# Patient Record
Sex: Female | Born: 1980 | Race: Black or African American | Hispanic: No | Marital: Single | Smoking: Current every day smoker
Health system: Southern US, Community
[De-identification: ages and names within clinical notes are randomized; demographics above are authoritative.]

## PROBLEM LIST (undated history)

## (undated) DIAGNOSIS — J302 Other seasonal allergic rhinitis: Secondary | ICD-10-CM

## (undated) DIAGNOSIS — Z789 Other specified health status: Secondary | ICD-10-CM

## (undated) HISTORY — PX: NO PAST SURGERIES: SHX2092

## (undated) HISTORY — PX: KNEE SURGERY: SHX244

---
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
## 2000-02-10 ENCOUNTER — Ambulatory Visit (HOSPITAL_COMMUNITY): Admission: RE | Admit: 2000-02-10 | Discharge: 2000-02-10 | Payer: Self-pay | Admitting: *Deleted

## 2000-02-10 ENCOUNTER — Encounter: Payer: Self-pay | Admitting: *Deleted

## 2000-04-24 ENCOUNTER — Inpatient Hospital Stay (HOSPITAL_COMMUNITY): Admission: AD | Admit: 2000-04-24 | Discharge: 2000-04-27 | Payer: Self-pay | Admitting: *Deleted

## 2000-07-06 ENCOUNTER — Emergency Department (HOSPITAL_COMMUNITY): Admission: EM | Admit: 2000-07-06 | Discharge: 2000-07-06 | Payer: Self-pay | Admitting: Emergency Medicine

## 2001-01-23 ENCOUNTER — Emergency Department (HOSPITAL_COMMUNITY): Admission: EM | Admit: 2001-01-23 | Discharge: 2001-01-23 | Payer: Self-pay | Admitting: Emergency Medicine

## 2001-03-23 ENCOUNTER — Ambulatory Visit (HOSPITAL_COMMUNITY): Admission: RE | Admit: 2001-03-23 | Discharge: 2001-03-24 | Payer: Self-pay | Admitting: Orthopedic Surgery

## 2001-06-10 ENCOUNTER — Encounter: Admission: RE | Admit: 2001-06-10 | Discharge: 2001-08-01 | Payer: Self-pay | Admitting: Orthopedic Surgery

## 2006-03-25 ENCOUNTER — Inpatient Hospital Stay (HOSPITAL_COMMUNITY): Admission: AD | Admit: 2006-03-25 | Discharge: 2006-03-27 | Payer: Self-pay | Admitting: Obstetrics

## 2007-07-21 ENCOUNTER — Inpatient Hospital Stay (HOSPITAL_COMMUNITY): Admission: AD | Admit: 2007-07-21 | Discharge: 2007-07-21 | Payer: Self-pay | Admitting: Obstetrics and Gynecology

## 2008-01-06 ENCOUNTER — Inpatient Hospital Stay (HOSPITAL_COMMUNITY): Admission: AD | Admit: 2008-01-06 | Discharge: 2008-01-08 | Payer: Self-pay | Admitting: Obstetrics and Gynecology

## 2010-08-26 NOTE — H&P (Signed)
NAMESHIKARA, MCAULIFFE              ACCOUNT NO.:  [id number]   MEDICAL RECORD NO.:  [id number]          PATIENT TYPE:  INP   LOCATION:  9113                          FACILITY:  [facility]   PHYSICIAN:  Osborn Coho, M.D.   DATE OF BIRTH:  1980-06-23   DATE OF ADMISSION:  01/06/2008  DATE OF DISCHARGE:                              HISTORY & PHYSICAL   Taylor York is a 30 year old single black female gravida 3, para 2-0-0-  2 at 40-4/7 weeks per an [facility] of January 02, 2008, who presents in  active labor unannounced.  Reports positive fetal movement.  She reports  some irregular contractions yesterday, but became regular about 3 a.m.  Denied any leakage of fluid, some bloody show, without other complaints.  Was desiring epidural on arrival.  She arrived at the hospital at  approximately [phone].  She has been followed by CNM service at [facility].  History remarkable for:   1. Macrosomia with previous pregnancy.  2. History of postpartum depression.  3. History of STDs.  4. First trimester chlamydia.  5. Patient with a history of polydactyly.   OBSTETRICAL HISTORY:  Gravida 1 spontaneous vaginal delivery January of  2002, female infant, Shela Commons, weighing 7 pounds 8 ounces at 39-5/7 weeks  after 24 hours of labor.  She did have an epidural.  No complications.  Gravida 2 spontaneous vaginal delivery December of 2007 of female,  Payton Doughty, weighing 9 pounds 7 ounces at 39 weeks, 12 hours of labor,  epidural, no complications by Dr. Gaynell Face.  Gravida 3 is current  pregnancy.   PRENATAL LABORATORIES:  Blood type is A+, Rh antibody screen negative,  RPR nonreactive, rubella titer immune.  Hepatitis surface antigen  negative, HIV nonreactive.  Cystic fibrosis screen was negative.  Pap  within normal limits March 5th.  Gonorrhea and chlamydia cultures.  Gonorrhea was negative at that time; however, chlamydia was positive.  Test of cure gonorrhea and chlamydia cultures were negative March 31.  Hemoglobin on  March the 5th 11.9, platelets were 191.  Group beta strep  negative in her 3rd trimester as well as gonorrhea and chlamydia  cultures also negative.   She reports SHRIMP causes swelling, hives, and itching.  She denies  medication or latex allergies.   Reports menarche at age 11 and cycles monthly, no abnormalities.  LMP  March 26, 2007 giving her an [facility] of January 02, 2008.  Had some  postpartum depression after her 2nd delivery on no medications as well  as after her 1st delivery for 6-7 months.  She has used oral  contraceptive pills in the past as well as condoms and Ortho Evra patch.  Treated in 2007 for Trichomonas, infrequent yeast infections, unsure  regarding varicella.  Patient with varicosities.  She had a history of  torn ACL 2003.  Was playing basketball which she had repaired.  She has  also had wisdom teeth extraction x3 in March of 2008.   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   FAMILY HISTORY:  Maternal grandmother chronic hypertension.  Mother with  varicosities.  Mother with seizure disorder when she was younger or  history of seizures.   SOCIAL HISTORY:  She is a single black female.  Father of the baby's  name is Gloris Ham.  He was not in labor and delivery today, unsure  if they are in a relationship still.  He has not been present at any of  her appointments I am aware of.  She did come in and was upset regarding  relationship matters around 25 weeks and was tearful and talked to Darnelle Maffucci, our  maternity care coordinator, about situation.  The patient  has a high school education, is unemployed.  Father of the baby has a  high school education, is a Electrical engineer.  Her sister and mother were  present.  Brought her into the hospital at time of admission tonight.  She denied alcohol, tobacco or illicit drug use.   HISTORY OF PRESENT  PREGNANCY:  The patient entered care February 23rd  for a new OB interview.  She was approximately 10-1/7 weeks.  Her  pregravid weight was 194.  Her height is 5 feet 8 inches.  She weighed  202 at her new OB workup on March the 5th at 11-3/7 weeks.  Planned CNM  care.  Planned also 1st trimester screen.  PAP was done at that time,  and had positive chlamydia culture and was treated.  She had a normal  1st trimester screen.  She was seen at 15 weeks.  Had GC and chlamydia  test of cure that were negative.  She complained of some brownish  discharge at that time.  Wet prep was positive for hyphae and was  treated with Diflucan.  She did have an early 1-hour GTT secondary to  history of macrosomia with her 2nd pregnancy which was within normal  limits.  She had a normal anatomy ultrasound at 18-4/7 weeks with normal  growth and development.  She had a wet prep again at 18-4/7 weeks and  was treated with Terazol 7.  As noted earlier, at 25-2/7 weeks she was  tearful in the office regarding father of baby and issues with their  relationship.  Denied any abuse or other domestic issues, reported  safety.  She at 28-2/7 weeks had a repeat 1-hour GTT, and it as well was  within normal limits.  Her hemoglobin at that time was 11.5.  RPR at  that time was nonreactive.  At approximately 30 weeks she had a total 20-  pound weight gain.  The patient is considering Implanon or IUD  postpartum.  She does plan to bottle feed.  The patient's pregnancy  progressed without any other noted complications with exception of some  minor pregnancy discomfort until her admission today.   PHYSICAL EXAMINATION:  VITAL SIGNS ON ADMISSION:  133/84, afebrile,  heart rate was 84.  EFM:  Fetal heart rate was in 120s, moderate  variability, some earlies and variables.  Toco contractions every 2  minutes, moderate to strong on palpation.  GENERAL:  She was in noted distress with her contractions, labored  breathing, but  alert and oriented x3.  HEENT:  Grossly intact within normal limits.  CARDIOVASCULAR:  Regular rate and rhythm without murmur.  LUNGS:  Clear to auscultation bilaterally.  ABDOMEN:  Soft, nontender, and gravid.  CERVICAL:  8 cm, 90%, -1 to 0 with bulging membranes.  EXTREMITIES:  Some mild  generalized lower extremity edema, nonpitting.  DTRs 2+, no clonus.   IMPRESSION:  1. Intrauterine pregnancy at 40-4/7 weeks.  2. Transition with bulging membranes.  3. Group beta strep negative.  4. History of macrosomia.   PLAN:  1. Admit to birthing suites with Dr. Su Hilt as attending physician.  2. Routine L&D orders.  3. Epidural as soon as possible.  4. AROM when comfortable.  5. Consult with MD p.r.n.      Candice Denny Levy, CNM      ______________________________  Osborn Coho, M.D.    CHS/MEDQ  D:  01/06/2008  T:  01/06/2008  Job:  [id number]

## 2010-08-29 NOTE — H&P (Signed)
[place]. [facility]  Patient:    Taylor York, Taylor York Visit Number: [id number] MRN: [id number]          Service Type: DSU Location: PEDS 6153 01 Attending Physician:  Wende Mott Dictated by:   Kennieth Rad, M.D. Admit Date:  03/23/2001                           History and Physical  CHIEF COMPLAINT:  Painful swollen left knee.  HISTORY OF PRESENT ILLNESS:  This is a 30 year old, young, black female who had a fall down some stairs on the day before Thanksgiving and sustained injury to her left knee.  The patient went to the [facility] Emergency Room on Thanksgiving Day the following day and had x-rays done and a knee immobilizer applied.  The patient returned to the office this week for the left knee injury.  The patient complained of pain, persistent swelling, and inability to bend the left knee.  PAST MEDICAL HISTORY:  No previous operations.  No history of high blood pressure or diabetes.  The patient has one child.  ALLERGIES:  None.  MEDICATIONS:  Hydrocodone.  FAMILY HISTORY:  Noncontributory.  REVIEW OF SYSTEMS:  Basically that of the history of present illness. Otherwise the patient has been in good health.  The patient denies being pregnant and has had regular menstrual periods.  Her last period was during the Thanksgiving weekend.  PHYSICAL EXAMINATION:  Temperature 99.2 degrees, pulse 76, respirations 18, blood pressure 125/64.  HEIGHT:  5 feet 8 inches.  WEIGHT:  165 pounds.  HEENT:  Normocephalic.  Eyes:  Conjunctivae and sclerae clear.  NECK:  Supple.  CHEST:  Clear.  CARDIAC:  S1 and S2 regular.  EXTREMITIES:  Left knee with limited range of motion, +2 effusion, and tender anteriorly.  Antalgic gait.  Neurovascular is intact.  X-ray revealed large bone fragment in the joint.  IMPRESSION:  Anterior cruciate ligament tear with avulsion of large bone fragment of the left knee. Dictated by:   Kennieth Rad, M.D. Attending Physician:  Wende Mott DD:  03/23/01 TD:  03/23/01 Job: [id number]

## 2010-08-29 NOTE — Op Note (Signed)
[place]. [facility]  Patient:    Taylor York, Taylor York Visit Number: [id number] MRN: [id number]          Service Type: DSU Location: PEDS 6153 01 Attending Physician:  Wende Mott Dictated by:   Kennieth Rad, M.D. Proc. Date: 03/23/01 Admit Date:  03/23/2001                             Operative Report  PREOPERATIVE DIAGNOSIS:  Anterior cruciate ligament tear and bone fragment of left knee.  POSTOPERATIVE DIAGNOSIS:  Anterior cruciate ligament tear and bone fragment of left knee.  PROCEDURES: 1. Arthroscopy. 2. Arthrotomy and repair of anterior cruciate ligament ligament of left knee    with anterior cruciate ligament screw.  ANESTHESIA:  General.  SURGEON:  Kennieth Rad, M.D.  DESCRIPTION OF PROCEDURE:  The patient was taken to the operating room after being given adequate preoperative medications.  Given general anesthesia and intubated.  The left knee was prepped with DuraPrep and draped in a sterile manner.  A tourniquet was used for hemostasis and Bovie.  A 1/2-inch puncture wound was made along the anterior, medial, and lateral joint lines.  Inflow of water through the medial suprapatellar pouch area.  Inspection of the joint revealed tremendous hematoma, which was evacuated.  Copious irrigation was done.  Inspection of the joint revealed ACL disk rupture with large bone fragment with articular cartilage attached to it.  Menisci intact.  Next, arthrotomy midline anterior incision made over the left knee going through the skin and subcutaneous tissue.  A medial paramedian incision was made into the capsule.  The patellar tendon was reflected laterally.  Identification of the fragment attached to the ACL was made.  The defect in the tibial surface was identified and the fragment was compressed back in its place.  With the ACL attached to it, this was stabilized with the ACL and screw.  A guide wire was placed down through the  fragment followed by punch reamer followed by placement of a 9 x 20 mm absorbable ACL screw.  The fragment was good and stable.  Further copious irrigation was done with antibiotic solution.  Wound closure was then done with 0 Vicryl for the fascia, 2-0 Vicryl for the subcutaneous, and skin staples for the skin.  A compressive dressing was applied.  A knee immobilizer was applied.  The patient tolerated the procedure quite well and went to the recovery room in stable and satisfactory condition. Dictated by:   Kennieth Rad, M.D. Attending Physician:  Wende Mott DD:  03/23/01 TD:  03/23/01 Job: [id number]

## 2011-01-12 LAB — CBC
HCT: 32.6 — ABNORMAL LOW
HCT: 36.9
Hemoglobin: 10.8 — ABNORMAL LOW
Hemoglobin: 12.3
MCHC: 33.1
MCHC: 33.4
MCV: 91.3
MCV: 92.8
Platelets: 154
Platelets: 166
RBC: 3.52 — ABNORMAL LOW
RBC: 4.05
RDW: 13.7
RDW: 14.3
WBC: 11.2 — ABNORMAL HIGH
WBC: 15.6 — ABNORMAL HIGH

## 2011-01-12 LAB — RPR: RPR Ser Ql: NONREACTIVE

## 2011-09-09 ENCOUNTER — Emergency Department (INDEPENDENT_AMBULATORY_CARE_PROVIDER_SITE_OTHER)
Admission: EM | Admit: 2011-09-09 | Discharge: 2011-09-09 | Disposition: A | Discharge status: Home / Self Care | Payer: Medicaid Other | Source: Home / Self Care | Attending: Family Medicine | Admitting: Family Medicine

## 2011-09-09 ENCOUNTER — Encounter (HOSPITAL_COMMUNITY): Payer: Self-pay | Admitting: Emergency Medicine

## 2011-09-09 DIAGNOSIS — Z041 Encounter for examination and observation following transport accident: Secondary | ICD-10-CM

## 2011-09-09 DIAGNOSIS — S0990XA Unspecified injury of head, initial encounter: Secondary | ICD-10-CM

## 2011-09-09 MED ORDER — CYCLOBENZAPRINE HCL 5 MG PO TABS: 5.0000 mg | ORAL_TABLET | Freq: Three times a day (TID) | ORAL | Status: AC | PRN

## 2011-09-09 MED ORDER — IBUPROFEN 800 MG PO TABS: 800.0000 mg | ORAL_TABLET | Freq: Three times a day (TID) | ORAL | Status: AC
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
## 2011-09-11 NOTE — ED Notes (Signed)
Pt. came in for work note. States she has been having to take the muscle relaxer TID and it makes her woozy. Discussed with Dr. Ladon Applebaum and he said she can return tomorrow. Note done as directed and given to pt. Taylor York 09/11/2011

## 2011-10-11 ENCOUNTER — Emergency Department (HOSPITAL_COMMUNITY)
Admission: EM | Admit: 2011-10-11 | Discharge: 2011-10-11 | Disposition: A | Discharge status: Home / Self Care | Payer: Medicaid Other | Source: Home / Self Care | Attending: Family Medicine | Admitting: Family Medicine

## 2011-10-11 ENCOUNTER — Encounter (HOSPITAL_COMMUNITY): Payer: Self-pay | Admitting: *Deleted

## 2011-10-11 DIAGNOSIS — N12 Tubulo-interstitial nephritis, not specified as acute or chronic: Secondary | ICD-10-CM

## 2011-10-11 HISTORY — DX: Other seasonal allergic rhinitis: J30.2

## 2011-10-11 LAB — POCT URINALYSIS DIP (DEVICE)
Protein, ur: >=300 mg/dL — AB
Urobilinogen, UA: 4 mg/dL — ABNORMAL HIGH (ref 0.0–1.0)
pH: 6 (ref 5.0–8.0)

## 2011-10-11 LAB — POCT PREGNANCY, URINE: Preg Test, Ur: NEGATIVE

## 2011-10-11 MED ORDER — PHENAZOPYRIDINE HCL 200 MG PO TABS: 200.0000 mg | ORAL_TABLET | Freq: Three times a day (TID) | ORAL | Status: AC

## 2011-10-11 MED ORDER — AMOXICILLIN-POT CLAVULANATE 500-125 MG PO TABS: 1.0000 | ORAL_TABLET | Freq: Two times a day (BID) | ORAL | Status: AC

## 2011-10-11 MED ORDER — ONDANSETRON HCL 4 MG PO TABS: 4.0000 mg | ORAL_TABLET | Freq: Four times a day (QID) | ORAL | Status: AC

## 2011-10-11 NOTE — ED Provider Notes (Signed)
History     CSN: [id number]  Arrival date & time 10/11/11  1122   First MD Initiated Contact with Patient 10/11/11 1311      Chief Complaint  Patient presents with  . Back Pain  . Urinary Frequency  . Dysuria    (Consider location/radiation/quality/duration/timing/severity/associated sxs/prior treatment) HPI Comments: Pt has also had nausea and 2 days of vomiting, approx 4 times a day.   Patient is a 31 y.o. female presenting with dysuria. The history is provided by the patient.  Dysuria  This is a new problem. Episode onset: 3 days ago. The problem occurs every urination. The problem has been gradually worsening. The quality of the pain is described as aching. The pain is at a severity of 4/10. The pain is moderate. The maximum temperature recorded prior to her arrival was 102 to 102.9 F. The fever has been present for 1 to 2 days. There is no history of pyelonephritis. Associated symptoms include chills, nausea, vomiting, frequency, urgency and flank pain. Pertinent negatives include no discharge, no hematuria and no possible pregnancy. She has tried nothing for the symptoms. Her past medical history does not include recurrent UTIs.    Past Medical History  Diagnosis Date  . Seasonal allergies     Past Surgical History  Procedure Date  . Knee surgery     left knee    History reviewed. No pertinent family history.  History  Substance Use Topics  . Smoking status: Current Everyday Smoker  . Smokeless tobacco: Not on file  . Alcohol Use: No    OB History    Grav Para Term Preterm Abortions TAB SAB Ect Mult Living                  Review of Systems  Constitutional: Positive for fever and chills.  Gastrointestinal: Positive for nausea, vomiting and abdominal pain.  Genitourinary: Positive for dysuria, urgency, frequency and flank pain. Negative for hematuria, vaginal bleeding and vaginal discharge.    Allergies  Review of patient's allergies indicates no known  allergies.  Home Medications   Current Outpatient Rx  Name Route Sig Dispense Refill  . IBUPROFEN 800 MG PO TABS Oral Take 800 mg by mouth every 8 (eight) hours as needed.    . AMOXICILLIN-POT CLAVULANATE 500-125 MG PO TABS Oral Take 1 tablet (500 mg total) by mouth 2 (two) times daily. 28 tablet 0  . ONDANSETRON HCL 4 MG PO TABS Oral Take 1 tablet (4 mg total) by mouth every 6 (six) hours. 12 tablet 0  . PHENAZOPYRIDINE HCL 200 MG PO TABS Oral Take 1 tablet (200 mg total) by mouth 3 (three) times daily. 6 tablet 0    BP 119/74  Pulse 92  Temp 99.3 F (37.4 C) (Oral)  Resp 18  SpO2 97%  LMP 09/19/2011  Physical Exam  Constitutional: She appears well-developed and well-nourished. No distress.       Pt afebrile but took ibuprofen today  Cardiovascular: Normal rate and regular rhythm.   Pulmonary/Chest: Effort normal and breath sounds normal.  Abdominal: Soft. Bowel sounds are normal. She exhibits no distension. There is tenderness in the epigastric area. There is no rebound, no guarding and no CVA tenderness.    ED Course  Procedures (including critical care time)  Labs Reviewed  POCT URINALYSIS DIP (DEVICE) - Abnormal; Notable for the following:    Bilirubin Urine SMALL (*)     Ketones, ur TRACE (*)     Hgb urine dipstick  LARGE (*)     Protein, ur >=300 (*)     Urobilinogen, UA 4.0 (*)     Nitrite POSITIVE (*)     Leukocytes, UA LARGE (*)  Biochemical Testing Only. Please order routine urinalysis from main lab if confirmatory testing is needed.   All other components within normal limits  POCT PREGNANCY, URINE  URINE CULTURE   No results found.   1. Pyelonephritis       MDM  Pt with sx of UTI/pyelonephritis.  No CVAT, but UA results plus vomiting are concerning.  Will tx pt for pyelo, have pt f/u with pcp.  Urine culture ordered.          Cathlyn Parsons, NP 10/11/11 1319

## 2011-10-11 NOTE — ED Notes (Signed)
Pt with c/o back pain Tuesday - Thursday chills and nausea/vomiting/frequent urination/pain with urination - 102 temp Friday -

## 2011-10-11 NOTE — Discharge Instructions (Signed)
Follow up with your doctor in 2 weeks when you have finished the medicine to make sure your urine is free of infection.  See your doctor sooner if you aren't feeling better or if you are feeling worse.  If you can't control the vomiting at home with the medicine, go to the ER.  Drink LOTS of liquids, no solid food until you haven't thrown up for 24 hours.    Pyelonephritis, Adult Pyelonephritis is a kidney infection. In general, there are 2 main types of pyelonephritis:  Infections that come on quickly without any warning (acute pyelonephritis).   Infections that persist for a long period of time (chronic pyelonephritis).  CAUSES  Two main causes of pyelonephritis are:  Bacteria traveling from the bladder to the kidney. This is a problem especially in pregnant women. The urine in the bladder can become filled with bacteria from multiple causes, including:   Inflammation of the prostate gland (prostatitis).   Sexual intercourse in females.   Bladder infection (cystitis).   Bacteria traveling from the bloodstream to the tissue part of the kidney.  Problems that may increase your risk of getting a kidney infection include:  Diabetes.   Kidney stones or bladder stones.   Cancer.   Catheters placed in the bladder.   Other abnormalities of the kidney or ureter.  SYMPTOMS   Abdominal pain.   Pain in the side or flank area.   Fever.   Chills.   Upset stomach.   Blood in the urine (dark urine).   Frequent urination.   Strong or persistent urge to urinate.   Burning or stinging when urinating.  DIAGNOSIS  Your caregiver may diagnose your kidney infection based on your symptoms. A urine sample may also be taken. TREATMENT  In general, treatment depends on how severe the infection is.   If the infection is mild and caught early, your caregiver may treat you with oral antibiotics and send you home.   If the infection is more severe, the bacteria may have gotten into the  bloodstream. This will require intravenous (IV) antibiotics and a hospital stay. Symptoms may include:   High fever.   Severe flank pain.   Shaking chills.   Even after a hospital stay, your caregiver may require you to be on oral antibiotics for a period of time.   Other treatments may be required depending upon the cause of the infection.  HOME CARE INSTRUCTIONS   Take your antibiotics as directed. Finish them even if you start to feel better.   Make an appointment to have your urine checked to make sure the infection is gone.   Drink enough fluids to keep your urine clear or pale yellow.   Take medicines for the bladder if you have urgency and frequency of urination as directed by your caregiver.  SEEK IMMEDIATE MEDICAL CARE IF:   You have a fever.   You are unable to take your antibiotics or fluids.   You develop shaking chills.   You experience extreme weakness or fainting.   There is no improvement after 2 days of treatment.  MAKE SURE YOU:  Understand these instructions.   Will watch your condition.   Will get help right away if you are not doing well or get worse.  Document Released: 03/30/2005 Document Revised: 03/19/2011 Document Reviewed: 09/03/2010 [facility] Patient Information 2012 [place], [place].Pyelonephritis, Adult Pyelonephritis is a kidney infection. In general, there are 2 main types of pyelonephritis:  Infections that come on quickly without  any warning (acute pyelonephritis).   Infections that persist for a long period of time (chronic pyelonephritis).  CAUSES  Two main causes of pyelonephritis are:  Bacteria traveling from the bladder to the kidney. This is a problem especially in pregnant women. The urine in the bladder can become filled with bacteria from multiple causes, including:   Inflammation of the prostate gland (prostatitis).   Sexual intercourse in females.   Bladder infection (cystitis).   Bacteria traveling from the  bloodstream to the tissue part of the kidney.  Problems that may increase your risk of getting a kidney infection include:  Diabetes.   Kidney stones or bladder stones.   Cancer.   Catheters placed in the bladder.   Other abnormalities of the kidney or ureter.  SYMPTOMS   Abdominal pain.   Pain in the side or flank area.   Fever.   Chills.   Upset stomach.   Blood in the urine (dark urine).   Frequent urination.   Strong or persistent urge to urinate.   Burning or stinging when urinating.  DIAGNOSIS  Your caregiver may diagnose your kidney infection based on your symptoms. A urine sample may also be taken. TREATMENT  In general, treatment depends on how severe the infection is.   If the infection is mild and caught early, your caregiver may treat you with oral antibiotics and send you home.   If the infection is more severe, the bacteria may have gotten into the bloodstream. This will require intravenous (IV) antibiotics and a hospital stay. Symptoms may include:   High fever.   Severe flank pain.   Shaking chills.   Even after a hospital stay, your caregiver may require you to be on oral antibiotics for a period of time.   Other treatments may be required depending upon the cause of the infection.  HOME CARE INSTRUCTIONS   Take your antibiotics as directed. Finish them even if you start to feel better.   Make an appointment to have your urine checked to make sure the infection is gone.   Drink enough fluids to keep your urine clear or pale yellow.   Take medicines for the bladder if you have urgency and frequency of urination as directed by your caregiver.  SEEK IMMEDIATE MEDICAL CARE IF:   You have a fever.   You are unable to take your antibiotics or fluids.   You develop shaking chills.   You experience extreme weakness or fainting.   There is no improvement after 2 days of treatment.  MAKE SURE YOU:  Understand these instructions.   Will  watch your condition.   Will get help right away if you are not doing well or get worse.  Document Released: 03/30/2005 Document Revised: 03/19/2011 Document Reviewed: 09/03/2010 [facility] Patient Information 2012 [place], [place].

## 2011-10-12 NOTE — ED Provider Notes (Signed)
Medical screening examination/treatment/procedure(s) were performed by resident physician or non-physician practitioner and as supervising physician I was immediately available for consultation/collaboration.   Barkley Bruns MD.    Linna Hoff, MD 10/12/11 2025

## 2011-10-13 ENCOUNTER — Other Ambulatory Visit (HOSPITAL_COMMUNITY): Payer: Self-pay | Admitting: Family Medicine

## 2011-10-13 DIAGNOSIS — N12 Tubulo-interstitial nephritis, not specified as acute or chronic: Secondary | ICD-10-CM

## 2011-10-13 LAB — URINE CULTURE: Colony Count: >=100000

## 2011-11-03 ENCOUNTER — Telehealth (HOSPITAL_COMMUNITY): Payer: Self-pay | Admitting: *Deleted

## 2011-11-03 NOTE — ED Notes (Signed)
Urine culture: >100,000 colonies E. Coli. Pt. treated with Augmentin- resistant to Ampicillin. Lab shown to Rica Mast NP.  She said it should be OK because of the Clauvanate but call for clinical improvement.  If not better have pt. come back for a recheck of her urine. I called and left a message for pt. to call. Cherly Anderson M

## 2011-11-05 ENCOUNTER — Telehealth (HOSPITAL_COMMUNITY): Payer: Self-pay | Admitting: *Deleted

## 2011-11-05 NOTE — ED Notes (Signed)
7/23 1800 Pt. called back and said she had to go back to her doctor 2 days later because she was no better.  States she got a Rocephin shot Tues. and felt better the next day.  She got another Rocephin  shot on Wed.  Rica Mast NP notified. Vassie Moselle 11/05/2011

## 2017-04-13 NOTE — L&D Delivery Note (Signed)
Patient was C/C/+1 and pushed for approx 5 minutes with epidural.    NSVD female infant, Apgars 8/9, weight pending.   The patient had no laceration. Fundus was firm. EBL was expected amount. Placenta was delivered intact. Vagina was clear.  Delayed cord clamping done for 30-60 seconds while warming baby. Baby was vigorous and doing skin to skin with mother.  Philip Aspen

## 2017-06-15 LAB — OB RESULTS CONSOLE ANTIBODY SCREEN: Antibody Screen: NEGATIVE

## 2017-06-15 LAB — OB RESULTS CONSOLE HIV ANTIBODY (ROUTINE TESTING): HIV: NONREACTIVE

## 2017-06-15 LAB — OB RESULTS CONSOLE RPR: RPR: NONREACTIVE

## 2017-06-15 LAB — OB RESULTS CONSOLE ABO/RH: RH Type: POSITIVE

## 2017-06-15 LAB — OB RESULTS CONSOLE HEPATITIS B SURFACE ANTIGEN: HEP B S AG: NEGATIVE

## 2017-06-15 LAB — OB RESULTS CONSOLE GC/CHLAMYDIA
Chlamydia: NEGATIVE
Gonorrhea: NEGATIVE

## 2017-06-15 LAB — OB RESULTS CONSOLE RUBELLA ANTIBODY, IGM: RUBELLA: IMMUNE

## 2017-12-08 LAB — OB RESULTS CONSOLE GBS: GBS: NEGATIVE

## 2017-12-23 ENCOUNTER — Other Ambulatory Visit: Payer: Self-pay | Admitting: Obstetrics and Gynecology

## 2017-12-24 ENCOUNTER — Encounter (HOSPITAL_COMMUNITY): Payer: Self-pay | Admitting: *Deleted

## 2017-12-24 ENCOUNTER — Telehealth (HOSPITAL_COMMUNITY): Payer: Self-pay | Admitting: *Deleted

## 2017-12-24 NOTE — Telephone Encounter (Signed)
Preadmission screen  

## 2018-01-05 ENCOUNTER — Inpatient Hospital Stay (HOSPITAL_COMMUNITY): Payer: Medicaid Other | Admitting: Anesthesiology

## 2018-01-05 ENCOUNTER — Other Ambulatory Visit: Payer: Self-pay

## 2018-01-05 ENCOUNTER — Encounter (HOSPITAL_COMMUNITY): Payer: Self-pay

## 2018-01-05 ENCOUNTER — Inpatient Hospital Stay (HOSPITAL_COMMUNITY)
Admission: RE | Admit: 2018-01-05 | Discharge: 2018-01-07 | DRG: 807 | Disposition: A | Discharge status: Home / Self Care | Payer: Medicaid Other | Attending: Obstetrics and Gynecology | Admitting: Obstetrics and Gynecology

## 2018-01-05 DIAGNOSIS — O403XX0 Polyhydramnios, third trimester, not applicable or unspecified: Secondary | ICD-10-CM | POA: Diagnosis present

## 2018-01-05 DIAGNOSIS — O99334 Smoking (tobacco) complicating childbirth: Secondary | ICD-10-CM | POA: Diagnosis present

## 2018-01-05 DIAGNOSIS — Z3A39 39 weeks gestation of pregnancy: Secondary | ICD-10-CM | POA: Diagnosis not present

## 2018-01-05 DIAGNOSIS — O09529 Supervision of elderly multigravida, unspecified trimester: Secondary | ICD-10-CM

## 2018-01-05 HISTORY — DX: Other specified health status: Z78.9

## 2018-01-05 LAB — CBC
HCT: 33.5 % — ABNORMAL LOW (ref 36.0–46.0)
HCT: 34.6 % — ABNORMAL LOW (ref 36.0–46.0)
HEMATOCRIT: 37.1 % (ref 36.0–46.0)
Hemoglobin: 11.2 g/dL — ABNORMAL LOW (ref 12.0–15.0)
Hemoglobin: 12.6 g/dL (ref 12.0–15.0)
Hemoglobin: 11.7 g/dL — ABNORMAL LOW (ref 12.0–15.0)
MCH: 30.2 pg (ref 26.0–34.0)
MCH: 30.5 pg (ref 26.0–34.0)
MCH: 30.7 pg (ref 26.0–34.0)
MCHC: 33.4 g/dL (ref 30.0–36.0)
MCHC: 33.8 g/dL (ref 30.0–36.0)
MCHC: 34 g/dL (ref 30.0–36.0)
MCV: 90.3 fL (ref 78.0–100.0)
MCV: 89.8 fL (ref 78.0–100.0)
MCV: 90.8 fL (ref 78.0–100.0)
PLATELETS: 158 10*3/uL (ref 150–400)
PLATELETS: 161 10*3/uL (ref 150–400)
PLATELETS: 166 10*3/uL (ref 150–400)
RBC: 3.71 MIL/uL — ABNORMAL LOW (ref 3.87–5.11)
RBC: 4.13 MIL/uL (ref 3.87–5.11)
RBC: 3.81 MIL/uL — ABNORMAL LOW (ref 3.87–5.11)
RDW: 15.1 % (ref 11.5–15.5)
RDW: 14.9 % (ref 11.5–15.5)
RDW: 15.1 % (ref 11.5–15.5)
WBC: 8.5 10*3/uL (ref 4.0–10.5)
WBC: 8.9 10*3/uL (ref 4.0–10.5)
WBC: 14.1 10*3/uL — ABNORMAL HIGH (ref 4.0–10.5)

## 2018-01-05 LAB — COMPREHENSIVE METABOLIC PANEL
ALT: 15 U/L (ref 0–44)
AST: 19 U/L (ref 15–41)
Albumin: 3 g/dL — ABNORMAL LOW (ref 3.5–5.0)
Alkaline Phosphatase: 140 U/L — ABNORMAL HIGH (ref 38–126)
Anion gap: 10 (ref 5–15)
BILIRUBIN TOTAL: 0.8 mg/dL (ref 0.3–1.2)
BUN: 9 mg/dL (ref 6–20)
CHLORIDE: 103 mmol/L (ref 98–111)
CO2: 20 mmol/L — ABNORMAL LOW (ref 22–32)
Calcium: 8.6 mg/dL — ABNORMAL LOW (ref 8.9–10.3)
Creatinine, Ser: 0.8 mg/dL (ref 0.44–1.00)
Glucose, Bld: 104 mg/dL — ABNORMAL HIGH (ref 70–99)
Potassium: 3.6 mmol/L (ref 3.5–5.1)
Sodium: 133 mmol/L — ABNORMAL LOW (ref 135–145)
TOTAL PROTEIN: 7.9 g/dL (ref 6.5–8.1)

## 2018-01-05 LAB — TYPE AND SCREEN
ABO/RH(D): A POS
Antibody Screen: NEGATIVE

## 2018-01-05 LAB — RPR: RPR Ser Ql: NONREACTIVE

## 2018-01-05 LAB — ABO/RH: ABO/RH(D): A POS

## 2018-01-05 MED ORDER — ZOLPIDEM TARTRATE 5 MG PO TABS: 5.0000 mg | ORAL_TABLET | Freq: Every evening | ORAL | Status: DC | PRN

## 2018-01-05 MED ORDER — DIBUCAINE 1 % RE OINT: 1.0000 "application " | TOPICAL_OINTMENT | RECTAL | Status: DC | PRN

## 2018-01-05 MED ORDER — WITCH HAZEL-GLYCERIN EX PADS: 1.0000 "application " | MEDICATED_PAD | CUTANEOUS | Status: DC | PRN

## 2018-01-05 MED ORDER — OXYTOCIN 40 UNITS IN LACTATED RINGERS INFUSION - SIMPLE MED
1.0000 m[IU]/min | INTRAVENOUS | Status: DC
  Administered 2018-01-05: 2 m[IU]/min via INTRAVENOUS

## 2018-01-05 MED ORDER — PRENATAL MULTIVITAMIN CH
1.0000 | ORAL_TABLET | Freq: Every day | ORAL | Status: DC
  Administered 2018-01-06: 1 via ORAL
  Filled 2018-01-05: qty 1

## 2018-01-05 MED ORDER — TETANUS-DIPHTH-ACELL PERTUSSIS 5-2.5-18.5 LF-MCG/0.5 IM SUSP: 0.5000 mL | Freq: Once | INTRAMUSCULAR | Status: DC

## 2018-01-05 MED ORDER — EPHEDRINE 5 MG/ML INJ: 10.0000 mg | INTRAVENOUS | Status: DC | PRN

## 2018-01-05 MED ORDER — PHENYLEPHRINE 40 MCG/ML (10ML) SYRINGE FOR IV PUSH (FOR BLOOD PRESSURE SUPPORT)
PREFILLED_SYRINGE | INTRAVENOUS | Status: AC
  Filled 2018-01-05: qty 10

## 2018-01-05 MED ORDER — COCONUT OIL OIL
1.0000 "application " | TOPICAL_OIL | Status: DC | PRN
  Administered 2018-01-06: 1 via TOPICAL
  Filled 2018-01-05: qty 120

## 2018-01-05 MED ORDER — LIDOCAINE-EPINEPHRINE (PF) 2 %-1:200000 IJ SOLN
INTRAMUSCULAR | Status: DC | PRN
  Administered 2018-01-05 (×2): 4 mL via EPIDURAL

## 2018-01-05 MED ORDER — DIPHENHYDRAMINE HCL 25 MG PO CAPS: 25.0000 mg | ORAL_CAPSULE | Freq: Four times a day (QID) | ORAL | Status: DC | PRN

## 2018-01-05 MED ORDER — LACTATED RINGERS IV SOLN
INTRAVENOUS | Status: DC
  Administered 2018-01-05 (×3): via INTRAVENOUS

## 2018-01-05 MED ORDER — PHENYLEPHRINE 40 MCG/ML (10ML) SYRINGE FOR IV PUSH (FOR BLOOD PRESSURE SUPPORT): 80.0000 ug | PREFILLED_SYRINGE | INTRAVENOUS | Status: DC | PRN

## 2018-01-05 MED ORDER — ACETAMINOPHEN 325 MG PO TABS: 650.0000 mg | ORAL_TABLET | ORAL | Status: DC | PRN

## 2018-01-05 MED ORDER — LACTATED RINGERS IV SOLN: 500.0000 mL | Freq: Once | INTRAVENOUS | Status: DC

## 2018-01-05 MED ORDER — OXYTOCIN 40 UNITS IN LACTATED RINGERS INFUSION - SIMPLE MED
2.5000 [IU]/h | INTRAVENOUS | Status: DC
  Filled 2018-01-05: qty 1000

## 2018-01-05 MED ORDER — SIMETHICONE 80 MG PO CHEW: 80.0000 mg | CHEWABLE_TABLET | ORAL | Status: DC | PRN

## 2018-01-05 MED ORDER — ONDANSETRON HCL 4 MG PO TABS: 4.0000 mg | ORAL_TABLET | ORAL | Status: DC | PRN

## 2018-01-05 MED ORDER — MISOPROSTOL 25 MCG QUARTER TABLET
25.0000 ug | ORAL_TABLET | ORAL | Status: DC | PRN
  Administered 2018-01-05 (×2): 25 ug via VAGINAL
  Filled 2018-01-05 (×2): qty 1

## 2018-01-05 MED ORDER — ONDANSETRON HCL 4 MG/2ML IJ SOLN: 4.0000 mg | Freq: Four times a day (QID) | INTRAMUSCULAR | Status: DC | PRN

## 2018-01-05 MED ORDER — SOD CITRATE-CITRIC ACID 500-334 MG/5ML PO SOLN: 30.0000 mL | ORAL | Status: DC | PRN

## 2018-01-05 MED ORDER — TERBUTALINE SULFATE 1 MG/ML IJ SOLN: 0.2500 mg | Freq: Once | INTRAMUSCULAR | Status: DC | PRN

## 2018-01-05 MED ORDER — IBUPROFEN 600 MG PO TABS
600.0000 mg | ORAL_TABLET | Freq: Four times a day (QID) | ORAL | Status: DC
  Administered 2018-01-05 – 2018-01-07 (×6): 600 mg via ORAL
  Filled 2018-01-05 (×6): qty 1

## 2018-01-05 MED ORDER — BENZOCAINE-MENTHOL 20-0.5 % EX AERO: 1.0000 "application " | INHALATION_SPRAY | CUTANEOUS | Status: DC | PRN

## 2018-01-05 MED ORDER — ONDANSETRON HCL 4 MG/2ML IJ SOLN: 4.0000 mg | INTRAMUSCULAR | Status: DC | PRN

## 2018-01-05 MED ORDER — SENNOSIDES-DOCUSATE SODIUM 8.6-50 MG PO TABS
2.0000 | ORAL_TABLET | ORAL | Status: DC
  Administered 2018-01-05 – 2018-01-07 (×2): 2 via ORAL
  Filled 2018-01-05 (×2): qty 2

## 2018-01-05 MED ORDER — OXYTOCIN BOLUS FROM INFUSION
500.0000 mL | Freq: Once | INTRAVENOUS | Status: AC
  Administered 2018-01-05: 500 mL via INTRAVENOUS

## 2018-01-05 MED ORDER — LACTATED RINGERS IV SOLN
500.0000 mL | INTRAVENOUS | Status: DC | PRN
  Administered 2018-01-05 (×2): 500 mL via INTRAVENOUS

## 2018-01-05 MED ORDER — LIDOCAINE HCL (PF) 1 % IJ SOLN
30.0000 mL | INTRAMUSCULAR | Status: DC | PRN
  Filled 2018-01-05: qty 30

## 2018-01-05 MED ORDER — FENTANYL CITRATE (PF) 100 MCG/2ML IJ SOLN
100.0000 ug | INTRAMUSCULAR | Status: DC | PRN
  Administered 2018-01-05 (×2): 100 ug via INTRAVENOUS
  Filled 2018-01-05: qty 2

## 2018-01-05 MED ORDER — FENTANYL CITRATE (PF) 100 MCG/2ML IJ SOLN
INTRAMUSCULAR | Status: AC
  Filled 2018-01-05: qty 2

## 2018-01-05 MED ORDER — OXYCODONE-ACETAMINOPHEN 5-325 MG PO TABS: 1.0000 | ORAL_TABLET | ORAL | Status: DC | PRN

## 2018-01-05 MED ORDER — FENTANYL 2.5 MCG/ML BUPIVACAINE 1/10 % EPIDURAL INFUSION (WH - ANES)
INTRAMUSCULAR | Status: AC
  Filled 2018-01-05: qty 100

## 2018-01-05 MED ORDER — OXYCODONE-ACETAMINOPHEN 5-325 MG PO TABS: 2.0000 | ORAL_TABLET | ORAL | Status: DC | PRN

## 2018-01-05 MED ORDER — FENTANYL 2.5 MCG/ML BUPIVACAINE 1/10 % EPIDURAL INFUSION (WH - ANES)
14.0000 mL/h | INTRAMUSCULAR | Status: DC | PRN
  Administered 2018-01-05: 14 mL/h via EPIDURAL

## 2018-01-05 MED ORDER — DIPHENHYDRAMINE HCL 50 MG/ML IJ SOLN: 12.5000 mg | INTRAMUSCULAR | Status: DC | PRN

## 2018-01-05 NOTE — Anesthesia Procedure Notes (Signed)
Epidural Patient location during procedure: OB Start time: 01/05/2018 11:35 AM End time: 01/05/2018 11:50 AM  Staffing Anesthesiologist: Elmer Picker, MD Performed: anesthesiologist   Preanesthetic Checklist Completed: patient identified, pre-op evaluation, timeout performed, IV checked, risks and benefits discussed and monitors and equipment checked  Epidural Patient position: sitting Prep: site prepped and draped and DuraPrep Patient monitoring: continuous pulse ox, blood pressure, heart rate and cardiac monitor Approach: midline Location: L3-L4 Injection technique: LOR air  Needle:  Needle type: Tuohy  Needle gauge: 17 G Needle length: 9 cm Needle insertion depth: 9 cm Catheter type: closed end flexible Catheter size: 19 Gauge Catheter at skin depth: 15 cm Test dose: negative  Assessment Sensory level: T8 Events: blood not aspirated, injection not painful, no injection resistance, negative IV test and no paresthesia  Additional Notes Patient identified. Risks/Benefits/Options discussed with patient including but not limited to bleeding, infection, nerve damage, paralysis, failed block, incomplete pain control, headache, blood pressure changes, nausea, vomiting, reactions to medication both or allergic, itching and postpartum back pain. Confirmed with bedside nurse the patient's most recent platelet count. Confirmed with patient that they are not currently taking any anticoagulation, have any bleeding history or any family history of bleeding disorders. Patient expressed understanding and wished to proceed. All questions were answered. Sterile technique was used throughout the entire procedure. Please see nursing notes for vital signs. Test dose was given through epidural catheter and negative prior to continuing to dose epidural or start infusion. Warning signs of high block given to the patient including shortness of breath, tingling/numbness in hands, complete motor block,  or any concerning symptoms with instructions to call for help. Patient was given instructions on fall risk and not to get out of bed. All questions and concerns addressed with instructions to call with any issues or inadequate analgesia.  Reason for block:procedure for pain

## 2018-01-05 NOTE — Anesthesia Preprocedure Evaluation (Signed)
Anesthesia Evaluation  Patient identified by MRN, date of birth, ID band Patient awake    Reviewed: Allergy & Precautions, NPO status , Patient's Chart, lab work & pertinent test results  Airway Mallampati: II  TM Distance: >3 FB Neck ROM: Full    Dental no notable dental hx.    Pulmonary Current Smoker,    Pulmonary exam normal breath sounds clear to auscultation       Cardiovascular negative cardio ROS Normal cardiovascular exam Rhythm:Regular Rate:Normal     Neuro/Psych negative neurological ROS  negative psych ROS   GI/Hepatic negative GI ROS, Neg liver ROS,   Endo/Other  negative endocrine ROS  Renal/GU negative Renal ROS  negative genitourinary   Musculoskeletal negative musculoskeletal ROS (+)   Abdominal   Peds  Hematology negative hematology ROS (+)   Anesthesia Other Findings   Reproductive/Obstetrics (+) Pregnancy                             Anesthesia Physical Anesthesia Plan  ASA: II  Anesthesia Plan: Epidural   Post-op Pain Management:    Induction:   PONV Risk Score and Plan: Treatment may vary due to age or medical condition  Airway Management Planned: Natural Airway  Additional Equipment:   Intra-op Plan:   Post-operative Plan:   Informed Consent: I have reviewed the patients History and Physical, chart, labs and discussed the procedure including the risks, benefits and alternatives for the proposed anesthesia with the patient or authorized representative who has indicated his/her understanding and acceptance.     Plan Discussed with: Anesthesiologist  Anesthesia Plan Comments: (Patient identified. Risks, benefits, options discussed with patient including but not limited to bleeding, infection, nerve damage, paralysis, failed block, incomplete pain control, headache, blood pressure changes, nausea, vomiting, reactions to medication, itching, and post  partum back pain. Confirmed with bedside nurse the patient's most recent platelet count. Confirmed with the patient that they are not taking any anticoagulation, have any bleeding history or any family history of bleeding disorders. Patient expressed understanding and wishes to proceed. All questions were answered. )        Anesthesia Quick Evaluation

## 2018-01-05 NOTE — Anesthesia Pain Management Evaluation Note (Signed)
  CRNA Pain Management Visit Note  Patient: Taylor York, 37 y.o., female  "Hello I am a member of the anesthesia team at [facility]. We have an anesthesia team available at all times to provide care throughout the hospital, including epidural management and anesthesia for C-section. I don't know your plan for the delivery whether it a natural birth, water birth, IV sedation, nitrous supplementation, doula or epidural, but we want to meet your pain goals."   1.Was your pain managed to your expectations on prior hospitalizations?   Yes   2.What is your expectation for pain management during this hospitalization?     Epidural  3.How can we help you reach that goal? unsure  Record the patient's initial score and the patient's pain goal.   Pain: 4  Pain Goal: 6 [facility] wants you to be able to say your pain was always managed very well.  Cephus ShellingBURGER,Taylor York 01/05/2018

## 2018-01-05 NOTE — Progress Notes (Signed)
CBC and CMP being drawn at this time.

## 2018-01-05 NOTE — H&P (Addendum)
37 y.o. 2747w6d  G4P3000 comes in c/o for schedule IOL for AMA and polyhydramnios.  Pt has been taking 81mg  ASA daily.  Otherwise has good fetal movement and no bleeding.  Past Medical History:  Diagnosis Date  . Medical history non-contributory   . Seasonal allergies     Past Surgical History:  Procedure Laterality Date  . KNEE SURGERY     left knee  . NO PAST SURGERIES      OB History  Gravida Para Term Preterm AB Living  4 3 3         SAB TAB Ectopic Multiple Live Births               # Outcome Date GA Lbr Len/2nd Weight Sex Delivery Anes PTL Lv  4 Current           3 Term 01/06/08 6192w1d   F Vag-Spont     2 Term 03/25/06 4715w1d   M Vag-Spont     1 Term 04/25/00 1574w5d   M Vag-Spont       Social History   Socioeconomic History  . Marital status: Single    Spouse name: Not on file  . Number of children: Not on file  . Years of education: Not on file  . Highest education level: Not on file  Occupational History  . Not on file  Social Needs  . Financial resource strain: Not on file  . Food insecurity:    Worry: Not on file    Inability: Not on file  . Transportation needs:    Medical: Not on file    Non-medical: Not on file  Tobacco Use  . Smoking status: Current Every Day Smoker  Substance and Sexual Activity  . Alcohol use: No  . Drug use: No  . Sexual activity: Not on file  Lifestyle  . Physical activity:    Days per week: Not on file    Minutes per session: Not on file  . Stress: Not on file  Relationships  . Social connections:    Talks on phone: Not on file    Gets together: Not on file    Attends religious service: Not on file    Active member of club or organization: Not on file    Attends meetings of clubs or organizations: Not on file    Relationship status: Not on file  . Intimate partner violence:    Fear of current or ex partner: Not on file    Emotionally abused: Not on file    Physically abused: Not on file    Forced sexual activity: Not on  file  Other Topics Concern  . Not on file  Social History Narrative  . Not on file   Shellfish allergy    Prenatal Transfer Tool  Maternal Diabetes: No Genetic Screening: Normal Maternal Ultrasounds/Referrals: Normal Fetal Ultrasounds or other Referrals:  None Maternal Substance Abuse:  No Significant Maternal Medications:  None Significant Maternal Lab Results: Lab values include: Group B Strep negative  Other PNC: uncomplicated.    Vitals:   01/05/18 0714 01/05/18 0730 01/05/18 0827 01/05/18 0929  BP: 130/69  138/81 (!) 114/57  Pulse: 89  86 78  Resp: 16  18 18   Temp:  97.6 F (36.4 C)    TempSrc:  Oral    Weight:      Height:        Lungs/Cor:  NAD Abdomen:  soft, gravid Ex:  no cords, erythema SVE:  0/TH/-3 at  admission, 0.5 FHTs:  120, good STV, NST R; Cat 1 tracing. Toco:  q 2-4   A/P   Admitted for IOL at 39.6  GBS Neg  S/p cytotec x2  Most recent US 12/01/2017- EFW 84% (6#7)- EFW by leopold's today 9-9.5#.  Discussed limitations of wt estimated.  Discussed risks of fetal macrosomia including shoulder dystocia and catastrophic outcomes.  Pt understands and would like to proceed with IOL for vaginal delivery. Multiple mild range BPs since admission, repeat CBC and add CMP  Continue current mgmt  Taylor York

## 2018-01-05 NOTE — Progress Notes (Signed)
DR Claiborne Billings in office and will head to [facility]

## 2018-01-06 LAB — CBC
HEMATOCRIT: 31.7 % — AB (ref 36.0–46.0)
Hemoglobin: 10.8 g/dL — ABNORMAL LOW (ref 12.0–15.0)
MCH: 30.7 pg (ref 26.0–34.0)
MCHC: 34.1 g/dL (ref 30.0–36.0)
MCV: 90.1 fL (ref 78.0–100.0)
Platelets: 161 10*3/uL (ref 150–400)
RBC: 3.52 MIL/uL — ABNORMAL LOW (ref 3.87–5.11)
RDW: 15.2 % (ref 11.5–15.5)
WBC: 17.4 10*3/uL — AB (ref 4.0–10.5)

## 2018-01-06 NOTE — Anesthesia Postprocedure Evaluation (Signed)
Anesthesia Post Note  Patient: Taylor York  Procedure(s) Performed: AN AD HOC LABOR EPIDURAL     Patient location during evaluation: Mother Baby Anesthesia Type: Epidural Level of consciousness: awake, awake and alert and oriented Pain management: pain level controlled Vital Signs Assessment: post-procedure vital signs reviewed and stable Respiratory status: spontaneous breathing and respiratory function stable Cardiovascular status: blood pressure returned to baseline and stable Postop Assessment: no headache, epidural receding, patient able to bend at knees, adequate PO intake, no backache, no apparent nausea or vomiting and able to ambulate Anesthetic complications: no    Last Vitals:  Vitals:   01/05/18 2300 01/06/18 0526  BP: (!) 150/79 134/85  Pulse:  93  Resp:  17  Temp:  36.7 C  SpO2:      Last Pain:  Vitals:   01/06/18 0526  TempSrc: Oral  PainSc:    Pain Goal:                 Cleda Clarks

## 2018-01-06 NOTE — Progress Notes (Signed)
Post Partum Day 1 Subjective: no complaints, up ad lib, voiding and tolerating PO  Objective: Blood pressure 134/85, pulse 93, temperature 98 F (36.7 C), temperature source Oral, resp. rate 17, height 5\' 9"  (1.753 m), weight 115.8 kg, SpO2 99 %, unknown if currently breastfeeding.  Vitals:   01/05/18 1925 01/05/18 2030 01/05/18 2300 01/06/18 0526  BP: (!) 154/98 (!) 144/86 (!) 150/79 134/85  Pulse: 92 100  93  Resp:    17  Temp:    98 F (36.7 C)  TempSrc:    Oral  SpO2:      Weight:      Height:         Physical Exam:  General: alert Lochia: appropriate Uterine Fundus: firm DVT Evaluation: No evidence of DVT seen on physical exam.  Recent Labs    01/05/18 1625 01/06/18 0632  HGB 11.7* 10.8*  HCT 34.6* 31.7*    Assessment/Plan: Plan for discharge tomorrow  BPs have been elevated greater than during pregnancy. Pt denies HA/vision changes. Will monitor today. May need to add antihypertensive   LOS: 1 day   Waynard Reeds 01/06/2018, 9:12 AM

## 2018-01-06 NOTE — Progress Notes (Signed)
Patient pulled IV catheter out after taking ashower. Catheter intact, site wnl.

## 2018-01-06 NOTE — Lactation Note (Signed)
This note was copied from a baby's chart. Lactation Consultation Note  Patient Name: Taylor York [id number] Date: 01/06/2018 Reason for consult: Initial assessment;1st time breastfeeding;Term  P4 mother whose infant is now 61 hours old.  Mother has 3 other children but did not breast feed any of them.  She hopes to exclusively breast feed this baby.  Baby was sleeping in bassinet when I arrived.  Mother had many basic breast feeding questions.  I reviewed feeding cues, hand expression, milk coming to volume, how to awaken a sleepy baby, positioning, voids/stools, how to obtain a good latch and how to know when baby has fed long enough.  Encouraged mother to feed 8-12 times/24 hours or sooner if she shows feeding cues.  She will do hand expression before/after feedings.  Colostrum container provided and milk storage times reviewed.  Encourgaed mother to call for latch assistance as needed.  Her breasts are soft and non tender and nipples are slightly sensitive but intact.  Suggested using EBM and coconut oil for lubrication.  Mother verbalized understanding.    Mom made aware of O/P services, breastfeeding support groups, community resources, and our phone # for post-discharge questions.      Maternal Data Formula Feeding for Exclusion: No Has patient been taught Hand Expression?: Yes Does the patient have breastfeeding experience prior to this delivery?: No  Feeding Feeding Type: Breast Fed Length of feed: 30 min  LATCH Score                   Interventions    Lactation Tools Discussed/Used WIC Program: Yes   Consult Status Consult Status: Follow-up Date: 01/07/18 Follow-up type: In-patient    Kamauri Denardo R Cheril Slattery 01/06/2018, 1:16 PM

## 2018-01-07 MED ORDER — IBUPROFEN 600 MG PO TABS: 600.0000 mg | ORAL_TABLET | Freq: Four times a day (QID) | ORAL | 0 refills | Status: AC

## 2018-01-07 NOTE — Progress Notes (Signed)
Patient is doing well.  She is ambulating, voiding, tolerating PO.  Pain control is good.  Patient with mildly elevated BPs on PPD#0 in the 140s-150s/90s.  BPs have been consistent 130s/80s for the last 24 hours.  She has no history of preeclampsia and is otherwise asymptomatic.  Lochia is appropriate  Vitals:   01/06/18 0526 01/06/18 1401 01/06/18 2248 01/07/18 0547  BP: 134/85 136/83 134/80 139/86  Pulse: 93 74 88 84  Resp: 17 18 16    Temp: 98 F (36.7 C) 98.4 F (36.9 C) 98 F (36.7 C)   TempSrc: Oral Oral Oral   SpO2:  100%    Weight:      Height:        NAD Fundus firm Ext: trace edema b/l  Lab Results  Component Value Date   WBC 17.4 (H) 01/06/2018   HGB 10.8 (L) 01/06/2018   HCT 31.7 (L) 01/06/2018   MCV 90.1 01/06/2018   PLT 161 01/06/2018    --/--/A POS, A POS Performed at [facility], [address] [place], [place] [address]  (09/25 0120)/[facility]  A/P 37 y.o. G4P4001 PPD#2. Routine care.   Meeting all goals.  D/c to home today BPs have normalized since delivery and no history of preeclampsia.  Will plan f/u w me in office in 3-5 days for BP check  Nabilah Davoli GEFFEL Kamaiyah Uselton

## 2018-01-07 NOTE — Progress Notes (Signed)
Due to high hospital census, acuity, and limited staffing, CSW is unable to meet with MOB to complete assessment for [place] of 10.  PMAD education will be provided at discharge.     Please consult CSW if concerns arise or by MOB's request.   There are no barriers to discharge.   Edrian Melucci Boyd-Gilyard, MSW, LCSW Clinical Social Work [phone] 

## 2018-01-07 NOTE — Discharge Summary (Signed)
Obstetric Discharge Summary Reason for Admission: induction of labor for advanced maternal age Prenatal Procedures: none Intrapartum Procedures: spontaneous vaginal delivery Postpartum Procedures: none Complications-Operative and Postpartum: none.  Had mild range BPs on PPD#0 that normalized on PPD #1.  Was watched an additional 24 hours and BP on discharge was 130/80 Hemoglobin  Date Value Ref Range Status  01/06/2018 10.8 (L) 12.0 - 15.0 g/dL Final   HCT  Date Value Ref Range Status  01/06/2018 31.7 (L) 36.0 - 46.0 % Final    Physical Exam:  General: alert, cooperative and appears stated age 37: appropriate Uterine Fundus: firm DVT Evaluation: No evidence of DVT seen on physical exam.  Discharge Diagnoses: Term Pregnancy-delivered  Discharge Information: Date: 01/07/2018 Activity: pelvic rest Diet: routine Medications: PNV and Ibuprofen Condition: stable Instructions: refer to practice specific booklet Discharge to: home Follow-up Information    Philip Aspen, DO Follow up in 4 week(s).   Specialty:  Obstetrics and Gynecology Contact information: [address] [place] [address] [phone]        Marlow Baars, MD Follow up in 5 day(s).   Specialty:  Obstetrics Why:  blood pressure check Contact information: [address] [place] [address] [phone]           Newborn Data: Live born female  Birth Weight: 8 lb 13.3 oz (4005 g) APGAR: 8, 9  Newborn Delivery   Birth date/time:  01/05/2018 15:52:00 Delivery type:  Vaginal, Spontaneous     Home with mother.  [facility] GEFFEL Deitrich Steve 01/07/2018, 9:33 AM

## 2018-01-07 NOTE — Lactation Note (Signed)
This note was copied from a baby's chart. Lactation Consultation Note  Patient Name: Taylor York [id number] Date: 01/07/2018   Mom feeding formula on arrival.  Mom reports she had a rough night and has decided she is just going to formula feed.  Praised mom for breastfeeding.  Urged her to not completely give it up that they were doing such a good job. Mom reports she will think about it.  Discussed breastfeeding prior to formula feeding/pumping and offering emm in bottles.  Explained to mom she needed to do what she felt was best for her and her family.  Mom has [employer] breastfeeding consultaion brochures and breastfeeding resource list.  Maternal Data    Feeding    LATCH Score                   Interventions    Lactation Tools Discussed/Used     Consult Status      [name] 01/07/2018, 11:12 AM

## 2018-01-07 NOTE — Progress Notes (Signed)
Parent request formula to supplement breast feeding due to mother's fatigue and sore nipples. She does not wish to pump at this time. Parents have been informed of small tummy size of newborn, taught hand expression and understand the possible consequences of formula to the health of the infant. The possible consequences shared with patient include 1) Loss of confidence in breastfeeding 2) Engorgement 3) Allergic sensitization of baby(asthma/allergies) and 4) decreased milk supply for mother.After discussion of the above the mother decided to  supplement with formula.  The tool used to give formula supplement will be bottle with slow flow nipple given by dad.   Mother counseled to avoid artificial nipples because this practice may lead to latch difficulties,inadequate milk transfer and nipple soreness.   

## 2020-07-23 ENCOUNTER — Other Ambulatory Visit: Payer: Self-pay | Admitting: Family Medicine

## 2020-07-23 DIAGNOSIS — Z1231 Encounter for screening mammogram for malignant neoplasm of breast: Secondary | ICD-10-CM

## 2020-09-13 ENCOUNTER — Ambulatory Visit: Payer: Medicaid Other

## 2020-09-14 ENCOUNTER — Ambulatory Visit
Admission: RE | Admit: 2020-09-14 | Discharge: 2020-09-14 | Disposition: A | Discharge status: Home / Self Care | Payer: Medicaid Other | Source: Ambulatory Visit | Attending: Family Medicine | Admitting: Family Medicine

## 2020-09-14 ENCOUNTER — Other Ambulatory Visit: Payer: Self-pay

## 2020-09-14 DIAGNOSIS — Z1231 Encounter for screening mammogram for malignant neoplasm of breast: Secondary | ICD-10-CM

## 2021-09-01 ENCOUNTER — Other Ambulatory Visit: Payer: Self-pay | Admitting: Family Medicine

## 2021-09-01 DIAGNOSIS — Z1231 Encounter for screening mammogram for malignant neoplasm of breast: Secondary | ICD-10-CM

## 2021-10-17 ENCOUNTER — Ambulatory Visit: Payer: Medicaid Other

## 2021-10-22 ENCOUNTER — Ambulatory Visit: Payer: Medicaid Other

## 2021-11-05 ENCOUNTER — Encounter: Payer: Self-pay | Admitting: Radiology

## 2021-11-05 ENCOUNTER — Ambulatory Visit
Admission: RE | Admit: 2021-11-05 | Discharge: 2021-11-05 | Disposition: A | Discharge status: Home / Self Care | Payer: Medicaid Other | Source: Ambulatory Visit | Attending: Family Medicine | Admitting: Family Medicine

## 2021-11-05 DIAGNOSIS — Z1231 Encounter for screening mammogram for malignant neoplasm of breast: Secondary | ICD-10-CM

## 2022-03-10 IMAGING — MG MM DIGITAL SCREENING BILAT W/ TOMO AND CAD
8 series · 8 of 24 positions shown · non-contrast
Comparison: None.

CLINICAL DATA: Screening.

EXAM:
DIGITAL SCREENING BILATERAL MAMMOGRAM WITH TOMOSYNTHESIS AND CAD
TECHNIQUE: Bilateral screening digital craniocaudal and mediolateral oblique
mammograms were obtained. Bilateral screening digital breast
tomosynthesis was performed. The images were evaluated with
computer-aided detection.

[L CC synth-2D]
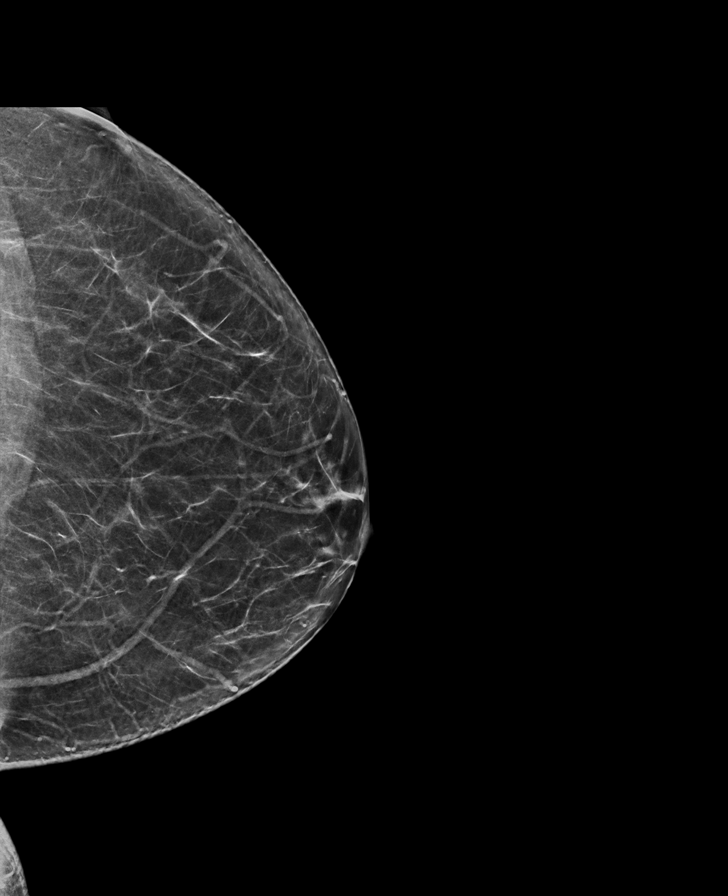

[R MLO synth-2D]
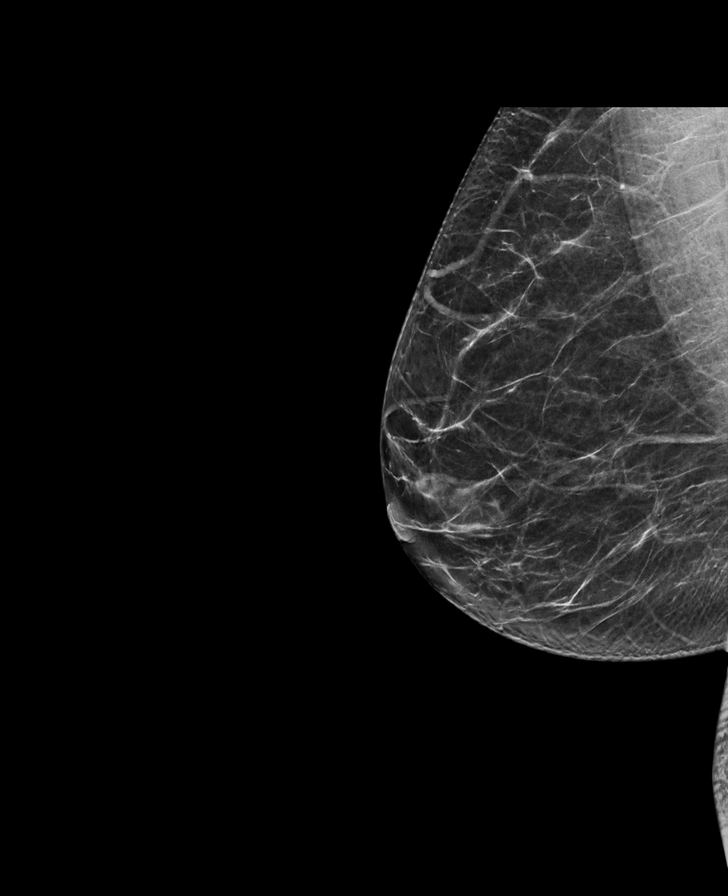

[L MLO synth-2D]
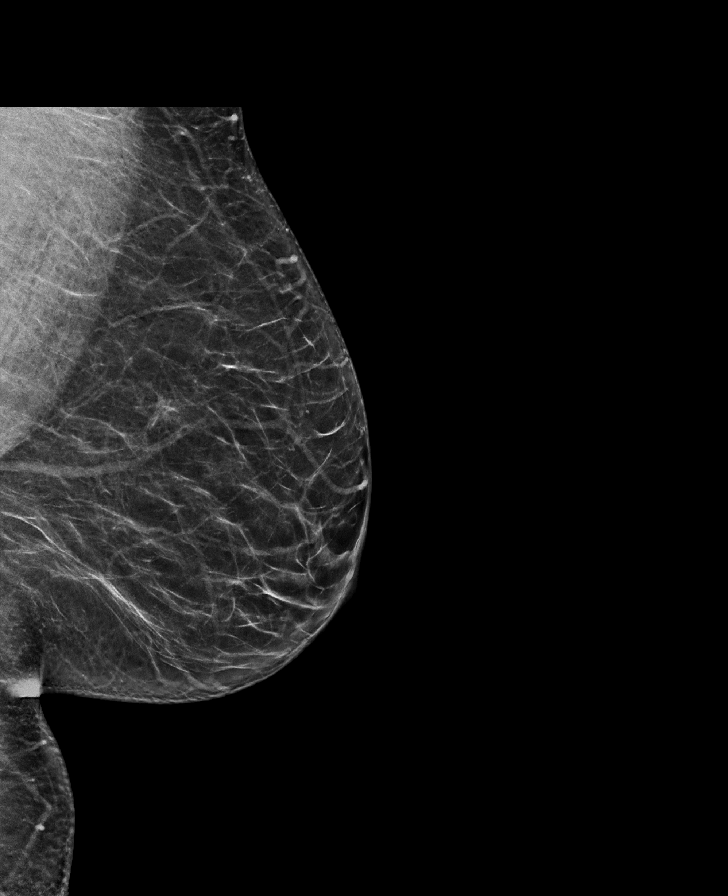

[R CC synth-2D]
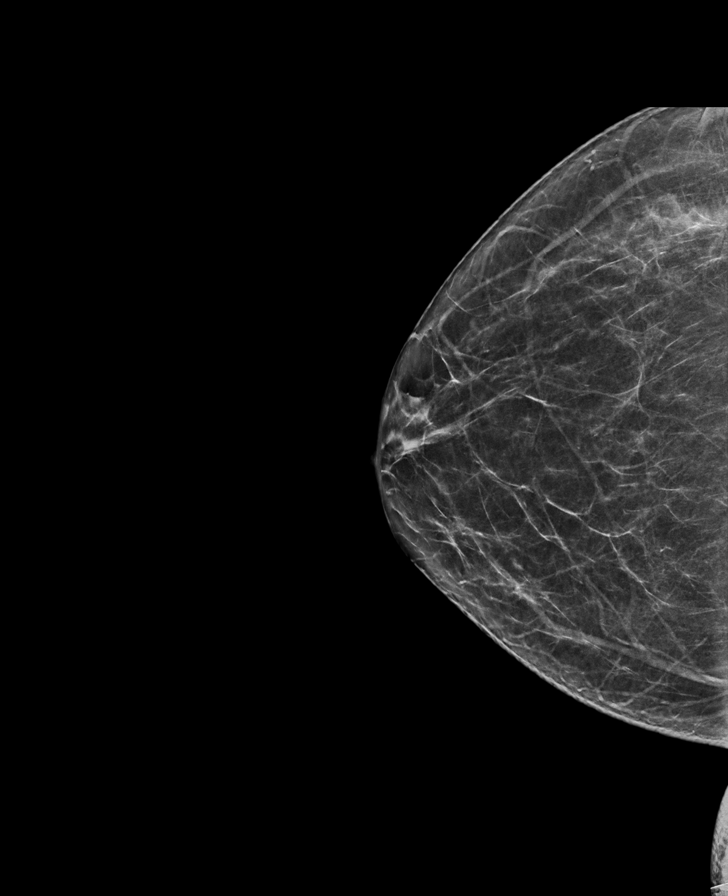

[L MLO tomo · tomo slice 36/71.0]
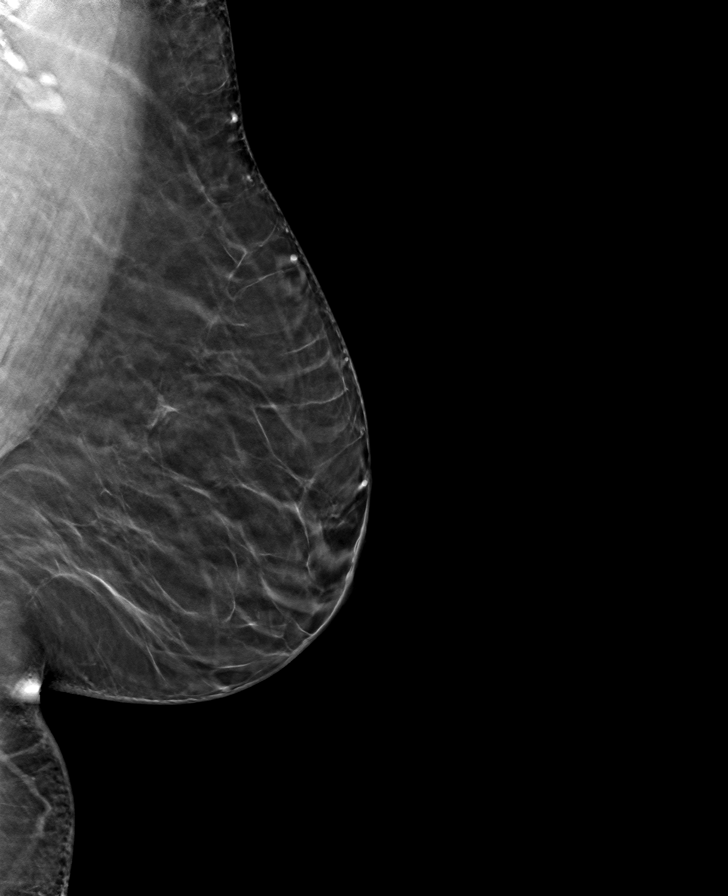

[R MLO tomo · tomo slice 34/67.0]
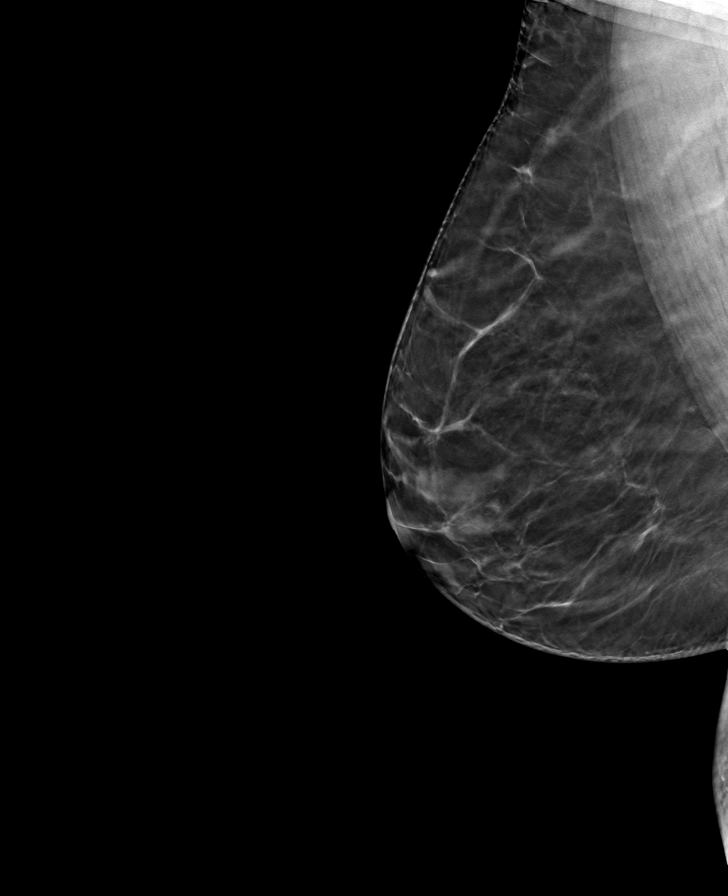

[R CC tomo · tomo slice 33/66.0]
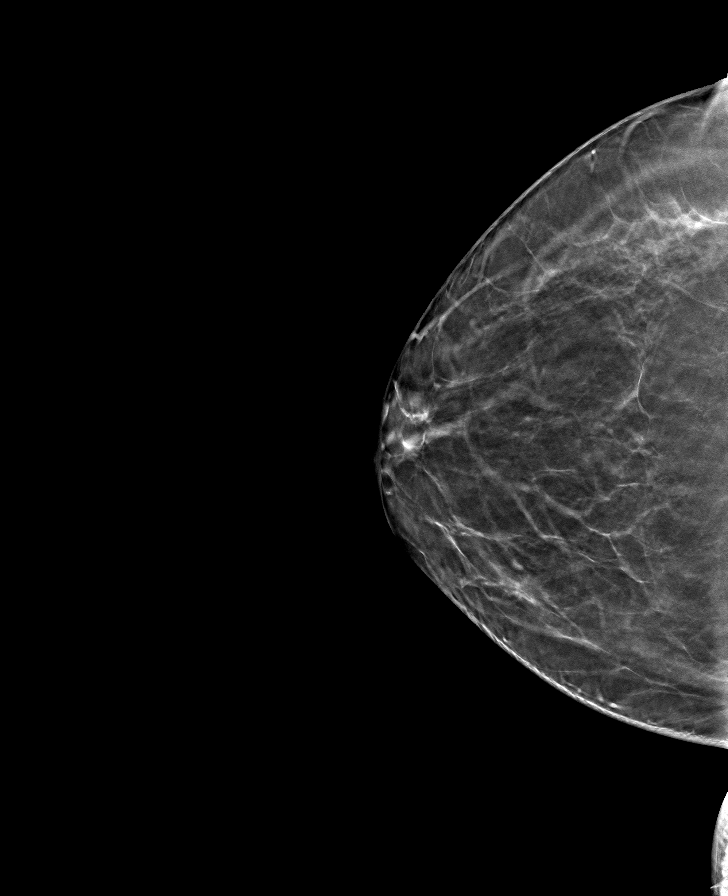

[L CC tomo · tomo slice 33/66.0]
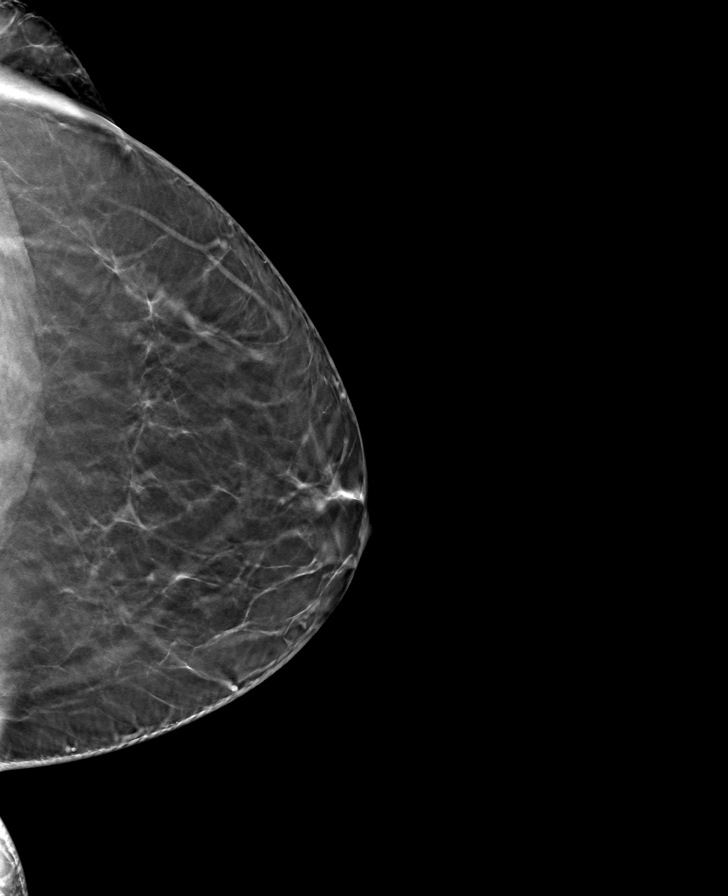

[8 of 24 positions shown; findings below may reference images not displayed]

ACR Breast Density Category b: There are scattered areas of
fibroglandular density.
FINDINGS: There are no findings suspicious for malignancy. The images were
evaluated with computer-aided detection.
IMPRESSION: No mammographic evidence of malignancy. A result letter of this
screening mammogram will be mailed directly to the patient.

RECOMMENDATION:
Screening mammogram in one year. (Code:C7-6-ASJ)

BI-RADS CATEGORY  1: Negative.

## 2022-09-22 ENCOUNTER — Other Ambulatory Visit: Payer: Self-pay | Admitting: Family Medicine

## 2022-09-22 DIAGNOSIS — Z1231 Encounter for screening mammogram for malignant neoplasm of breast: Secondary | ICD-10-CM

## 2022-11-09 ENCOUNTER — Ambulatory Visit
Admission: RE | Admit: 2022-11-09 | Discharge: 2022-11-09 | Disposition: A | Discharge status: Home / Self Care | Payer: Medicaid Other | Source: Ambulatory Visit | Attending: Family Medicine | Admitting: Family Medicine

## 2022-11-09 DIAGNOSIS — Z1231 Encounter for screening mammogram for malignant neoplasm of breast: Secondary | ICD-10-CM

## 2023-10-06 ENCOUNTER — Other Ambulatory Visit: Payer: Self-pay | Admitting: Family Medicine

## 2023-10-06 DIAGNOSIS — Z1231 Encounter for screening mammogram for malignant neoplasm of breast: Secondary | ICD-10-CM

## 2023-11-10 ENCOUNTER — Ambulatory Visit

## 2023-11-22 ENCOUNTER — Ambulatory Visit
Admission: RE | Admit: 2023-11-22 | Discharge: 2023-11-22 | Disposition: A | Discharge status: Home / Self Care | Source: Ambulatory Visit | Attending: Family Medicine

## 2023-11-22 DIAGNOSIS — Z1231 Encounter for screening mammogram for malignant neoplasm of breast: Secondary | ICD-10-CM
